# Patient Record
Sex: Female | Born: 1945 | Race: White | Hispanic: No | Marital: Married | Smoking: Former smoker
Health system: Southern US, Community
[De-identification: ages and names within clinical notes are randomized; demographics above are authoritative.]

## PROBLEM LIST (undated history)

## (undated) DIAGNOSIS — K219 Gastro-esophageal reflux disease without esophagitis: Secondary | ICD-10-CM

## (undated) DIAGNOSIS — I10 Essential (primary) hypertension: Secondary | ICD-10-CM

## (undated) HISTORY — PX: FOOT SURGERY: SHX648

## (undated) HISTORY — DX: Essential (primary) hypertension: I10

## (undated) HISTORY — PX: ABDOMINAL HYSTERECTOMY: SHX81

## (undated) HISTORY — PX: HEMORRHOID SURGERY: SHX153

## (undated) HISTORY — PX: CATARACT EXTRACTION, BILATERAL: SHX1313

## (undated) HISTORY — DX: Gastro-esophageal reflux disease without esophagitis: K21.9

---

## 2004-03-04 ENCOUNTER — Other Ambulatory Visit: Payer: Self-pay

## 2007-01-27 ENCOUNTER — Ambulatory Visit: Payer: Self-pay | Admitting: Family Medicine

## 2008-02-17 ENCOUNTER — Inpatient Hospital Stay: Payer: Self-pay | Admitting: Podiatry

## 2008-05-31 ENCOUNTER — Ambulatory Visit: Payer: Self-pay | Admitting: Unknown Physician Specialty

## 2008-06-12 ENCOUNTER — Ambulatory Visit: Payer: Self-pay | Admitting: Family Medicine

## 2008-07-11 ENCOUNTER — Ambulatory Visit: Payer: Self-pay | Admitting: General Surgery

## 2009-03-28 ENCOUNTER — Emergency Department: Payer: Self-pay | Admitting: Emergency Medicine

## 2009-10-08 ENCOUNTER — Ambulatory Visit: Payer: Self-pay | Admitting: Gastroenterology

## 2009-10-23 ENCOUNTER — Ambulatory Visit: Payer: Self-pay | Admitting: Gastroenterology

## 2009-11-07 ENCOUNTER — Ambulatory Visit: Payer: Self-pay | Admitting: Internal Medicine

## 2009-11-18 ENCOUNTER — Ambulatory Visit: Payer: Self-pay | Admitting: Internal Medicine

## 2010-10-19 HISTORY — PX: COLONOSCOPY: SHX174

## 2011-05-20 ENCOUNTER — Ambulatory Visit: Payer: Self-pay | Admitting: Internal Medicine

## 2011-08-11 IMAGING — US ABDOMEN ULTRASOUND
1 series · 17 of 25 positions shown · non-contrast
Comparison: none

REASON FOR EXAM: epigastric abd pain   ruq abd pain  chest pain and
reflux gerd   CCK IF US NEG
COMMENTS:

[Series 1: abdomen ultrasound · 17 of 74 slices shown]
[im 1/74]
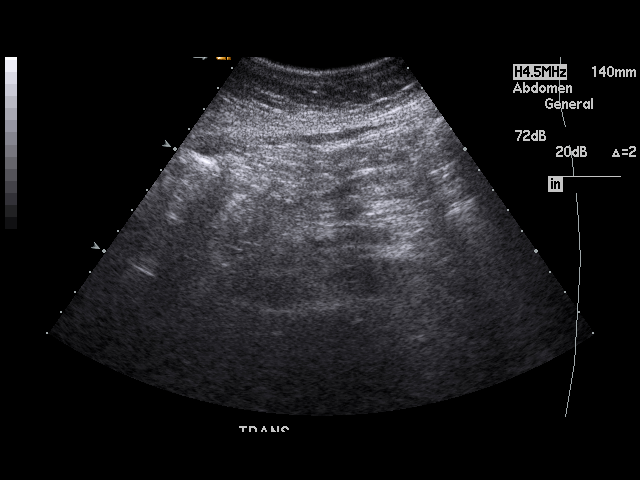
[im 7/74]
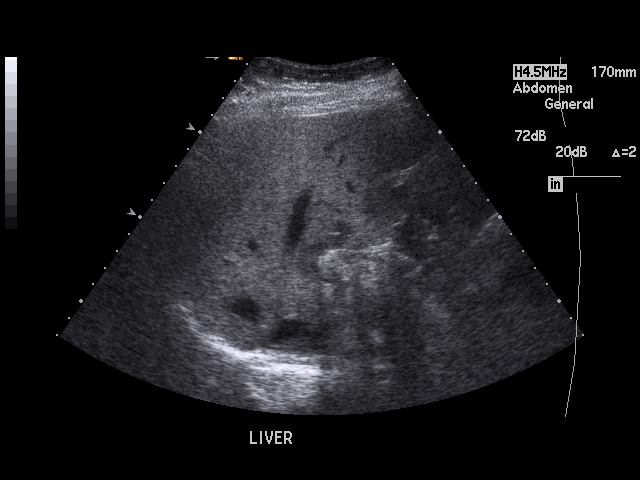
[im 10/74]
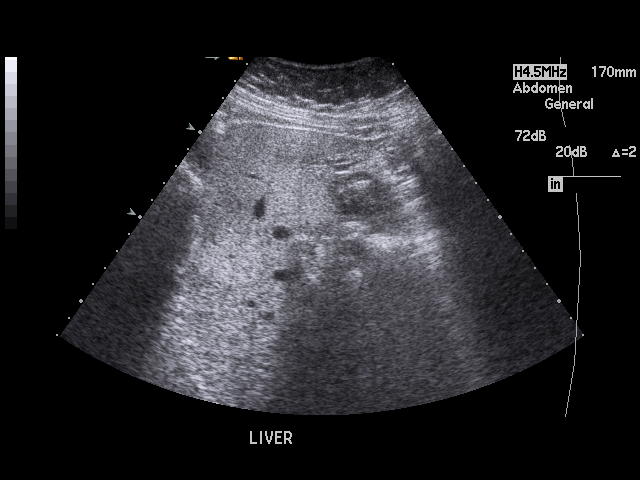
[im 16/74]
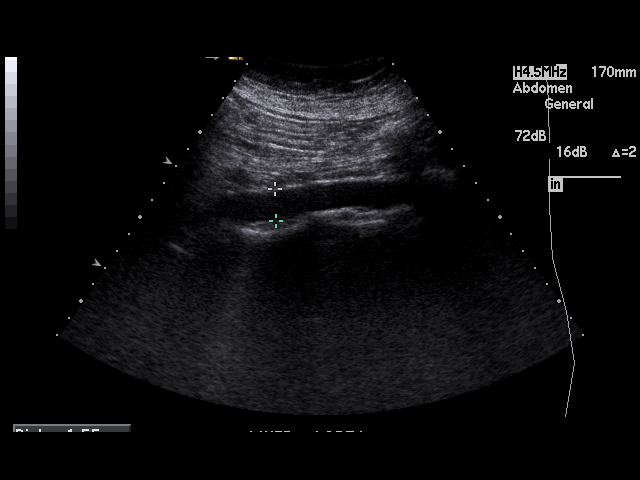
[im 19/74]
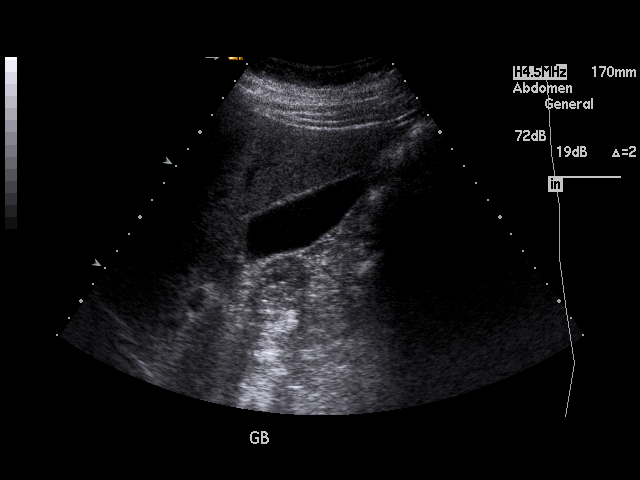
[im 25/74]
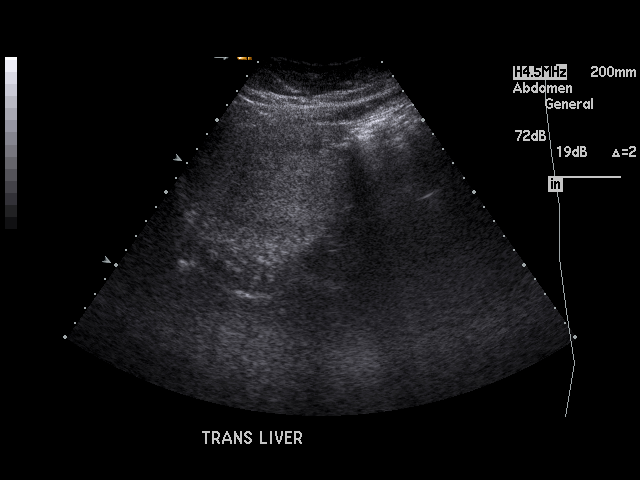
[im 28/74]
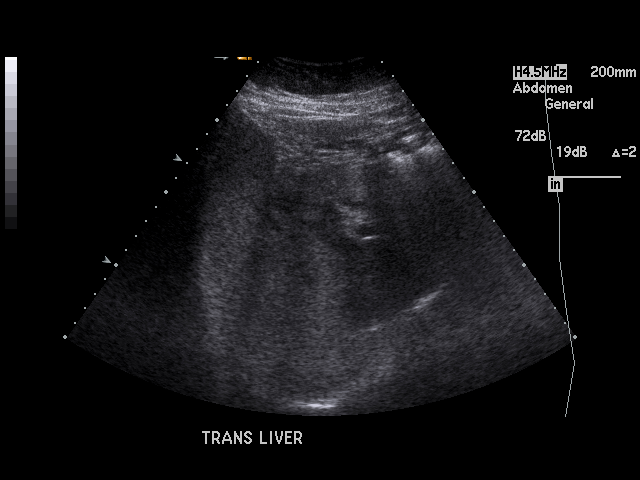
[im 34/74]
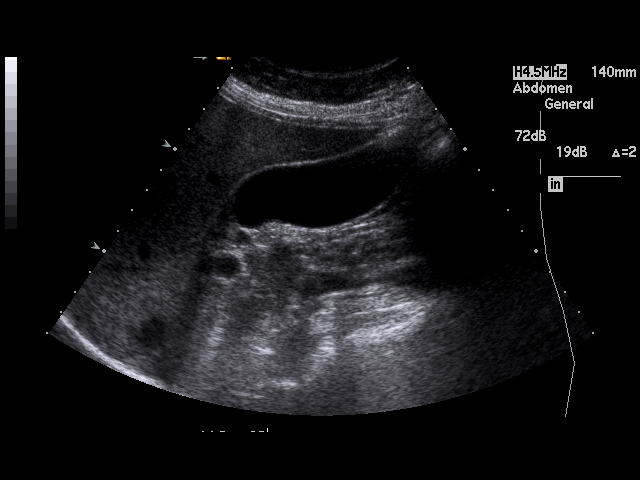
[im 37/74]
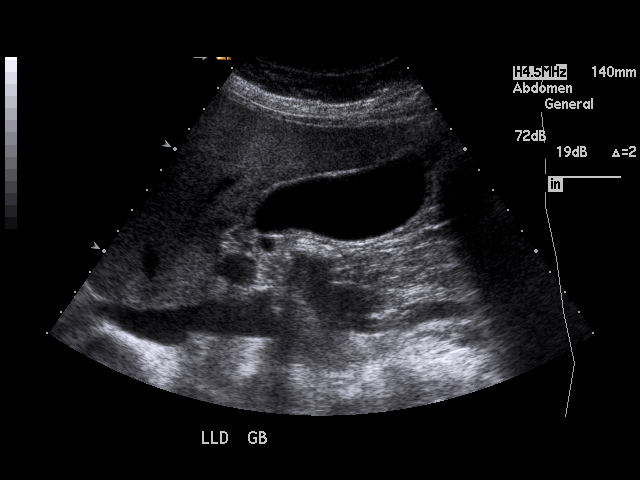
[im 40/74]
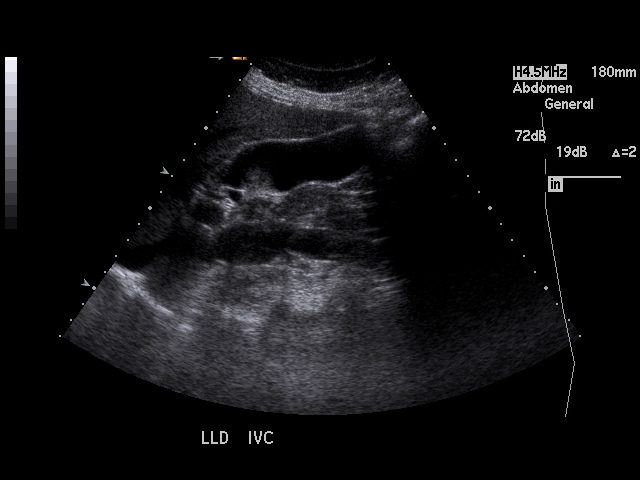
[im 46/74]
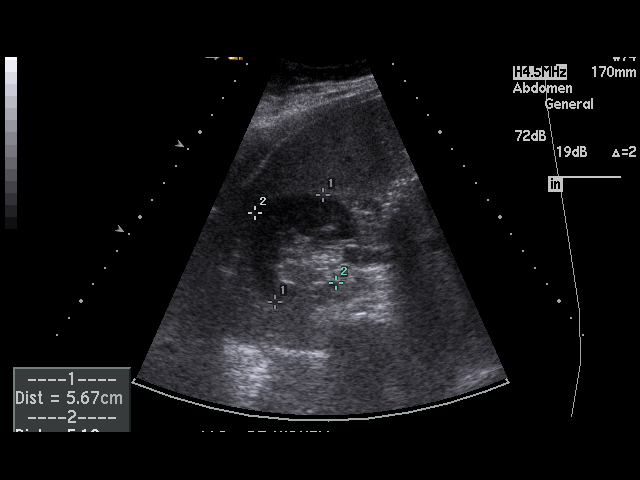
[im 49/74]
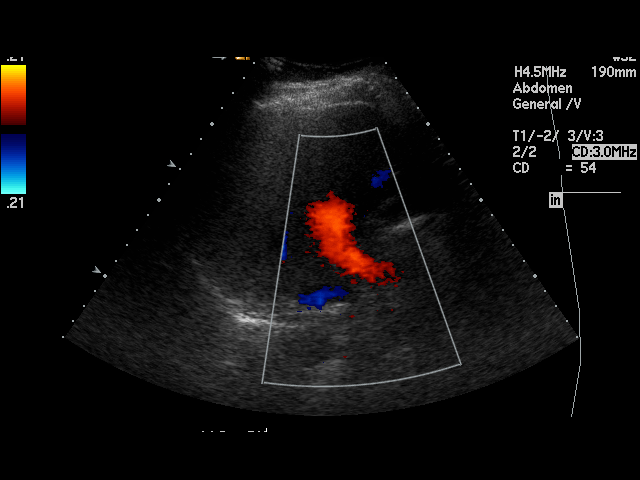
[im 55/74]
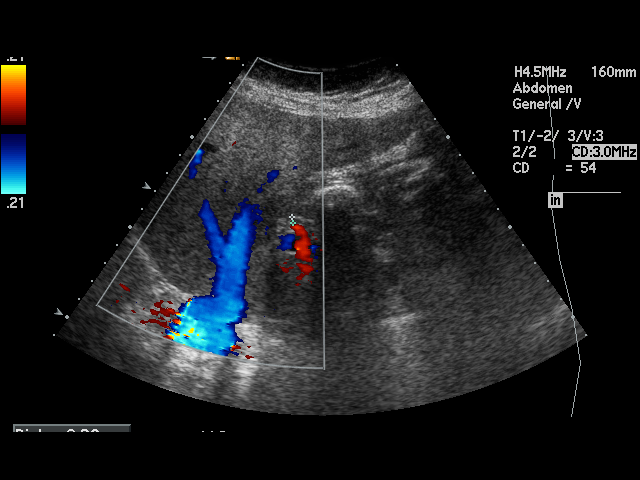
[im 58/74]
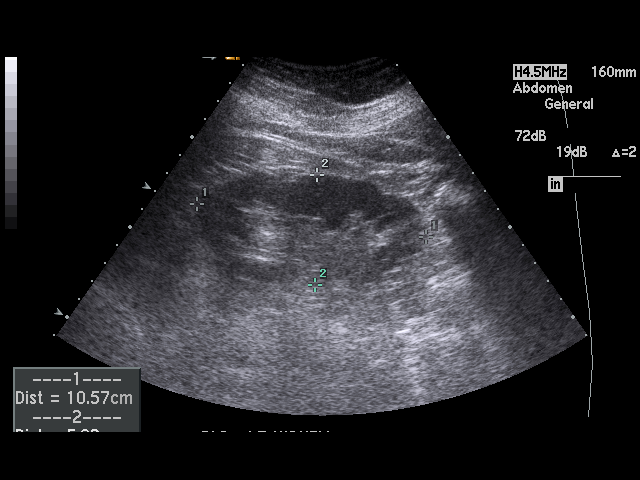
[im 64/74]
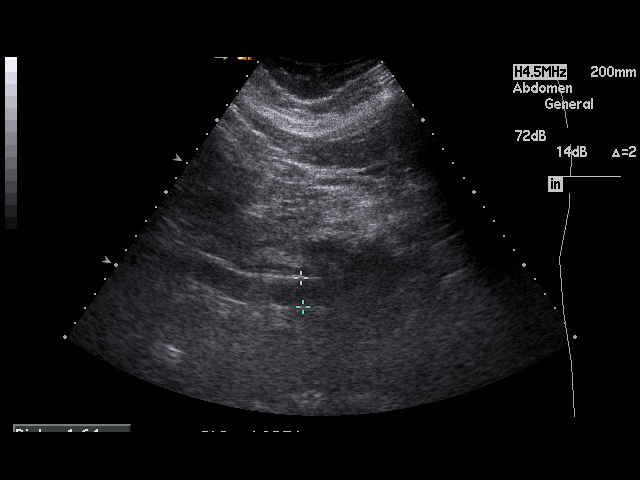
[im 67/74]
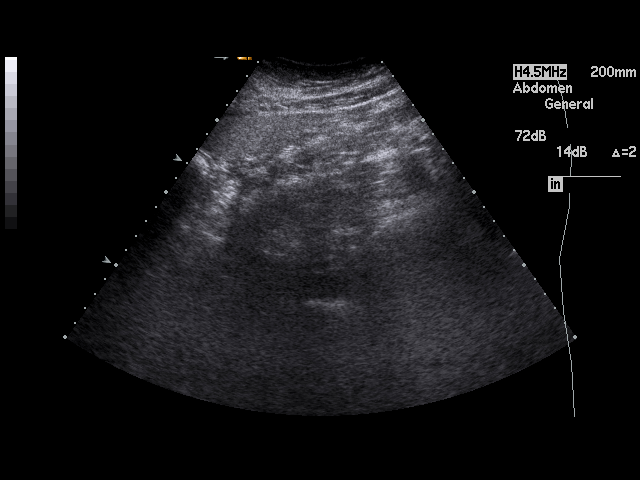
[im 74/74]
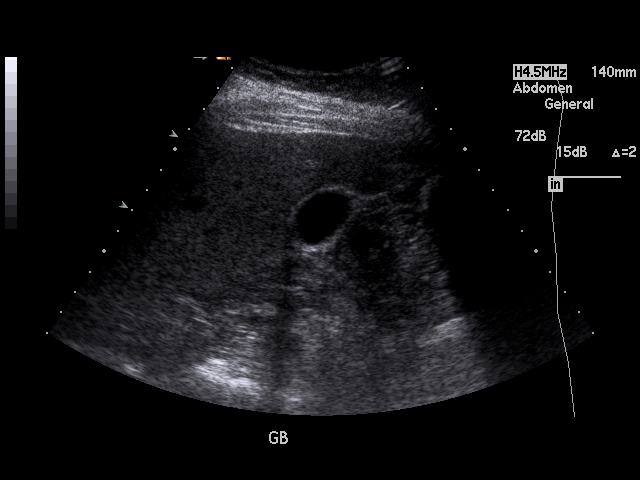

[17 of 25 positions shown; findings below may reference images not displayed]

PROCEDURE:     US  - US ABDOMEN GENERAL SURVEY  - October 08, 2009  [DATE]

RESULT:     The liver exhibits heterogeneous echotexture. I do not see
definite masses. There is likely an area of focal fatty sparing near the
porta hepatis. No intrahepatic ductal dilation is seen. Portal venous flow
is normal in direction toward the liver The pancreas could not be
demonstrated due to the presence of bowel gas. The gallbladder is adequately
distended with no evidence of stones, wall thickening, or pericholecystic
fluid. There is no sonographic Murphy's sign. The common bile duct is normal
at 3.3 mm in diameter.

The spleen, abdominal aorta, and kidneys are normal in appearance. There is
no evidence of ascites.
IMPRESSION: 1. There is fatty infiltrative change of the liver and somewhat
heterogeneous echotexture without definite discrete mass.
2. I do not see evidence of gallstones or evidence of other acute
hepatobiliary abnormality.

## 2011-08-28 ENCOUNTER — Ambulatory Visit: Payer: Self-pay | Admitting: General Surgery

## 2011-09-16 ENCOUNTER — Ambulatory Visit: Payer: Self-pay | Admitting: General Surgery

## 2011-10-01 ENCOUNTER — Ambulatory Visit: Payer: Self-pay | Admitting: General Surgery

## 2011-10-01 HISTORY — PX: CHOLECYSTECTOMY: SHX55

## 2011-12-21 ENCOUNTER — Ambulatory Visit: Payer: Self-pay | Admitting: Ophthalmology

## 2011-12-21 LAB — CREATININE, SERUM
Creatinine: 0.66 mg/dL (ref 0.60–1.30)
EGFR (African American): >60
EGFR (Non-African Amer.): >60

## 2011-12-28 ENCOUNTER — Ambulatory Visit: Payer: Self-pay | Admitting: Ophthalmology

## 2011-12-28 LAB — POTASSIUM: Potassium: 3.7 mmol/L (ref 3.5–5.1)

## 2012-01-06 ENCOUNTER — Ambulatory Visit: Payer: Self-pay | Admitting: Ophthalmology

## 2012-03-03 ENCOUNTER — Ambulatory Visit: Payer: Self-pay | Admitting: Ophthalmology

## 2012-03-03 LAB — POTASSIUM: Potassium: 4 mmol/L (ref 3.5–5.1)

## 2012-03-14 ENCOUNTER — Ambulatory Visit: Payer: Self-pay | Admitting: Ophthalmology

## 2012-04-22 ENCOUNTER — Ambulatory Visit: Payer: Self-pay | Admitting: Gastroenterology

## 2012-06-21 ENCOUNTER — Ambulatory Visit: Payer: Self-pay | Admitting: General Surgery

## 2012-06-21 LAB — BASIC METABOLIC PANEL
Anion Gap: 6 — ABNORMAL LOW (ref 7–16)
BUN: 9 mg/dL (ref 7–18)
Calcium, Total: 9.4 mg/dL (ref 8.5–10.1)
Co2: 29 mmol/L (ref 21–32)
EGFR (Non-African Amer.): >60
Glucose: 93 mg/dL (ref 65–99)
Osmolality: 270 (ref 275–301)
Potassium: 3.1 mmol/L — ABNORMAL LOW (ref 3.5–5.1)

## 2012-06-21 LAB — CBC WITH DIFFERENTIAL/PLATELET
Basophil %: 0.7 %
Eosinophil %: 1.7 %
HCT: 34.7 % — ABNORMAL LOW (ref 35.0–47.0)
Lymphocyte %: 35.6 %
MCH: 29.2 pg (ref 26.0–34.0)
MCHC: 33.8 g/dL (ref 32.0–36.0)
MCV: 86 fL (ref 80–100)
Neutrophil #: 2.9 10*3/uL (ref 1.4–6.5)
RDW: 13.3 % (ref 11.5–14.5)

## 2012-06-28 HISTORY — PX: NISSEN FUNDOPLICATION: SHX2091

## 2012-06-28 LAB — POTASSIUM: Potassium: 3.7 mmol/L (ref 3.5–5.1)

## 2012-06-30 ENCOUNTER — Inpatient Hospital Stay: Payer: Self-pay | Admitting: General Surgery

## 2012-07-01 LAB — CBC WITH DIFFERENTIAL/PLATELET
Basophil #: 0 10*3/uL (ref 0.0–0.1)
Eosinophil #: 0.2 10*3/uL (ref 0.0–0.7)
Eosinophil %: 2.8 %
HCT: 33 % — ABNORMAL LOW (ref 35.0–47.0)
Lymphocyte #: 1.2 10*3/uL (ref 1.0–3.6)
Lymphocyte %: 20.6 %
MCH: 29 pg (ref 26.0–34.0)
MCV: 88 fL (ref 80–100)
Monocyte #: 0.4 x10 3/mm (ref 0.2–0.9)
Monocyte %: 6.9 %
Neutrophil #: 4 10*3/uL (ref 1.4–6.5)
RDW: 13.5 % (ref 11.5–14.5)
WBC: 5.8 10*3/uL (ref 3.6–11.0)

## 2012-12-07 ENCOUNTER — Ambulatory Visit: Payer: Self-pay | Admitting: Internal Medicine

## 2013-01-29 ENCOUNTER — Encounter: Payer: Self-pay | Admitting: General Surgery

## 2015-02-05 NOTE — Op Note (Signed)
PATIENT NAME:  Patricia Vaughan, Patricia Vaughan MR#:  [id number] DATE OF BIRTH:  Nov 07, 1945  DATE OF PROCEDURE:  06/28/2012  PREOPERATIVE DIAGNOSIS: Uncontrolled gastroesophageal reflux.   POSTOPERATIVE DIAGNOSIS: Uncontrolled gastroesophageal reflux.   OPERATIVE PROCEDURES:  1. Diagnostic laparoscopy.  2. Exposure of esophageal hiatus and mobilization of the esophagus to the level of the pulmonary vasculature. 3. Release of the right diaphragm with repair of the diaphragmatic defect with xenograft placement. 4. Nissen fundoplication. 5. Gastropexy.   SURGEON: Earline MayotteJeffrey W. Kaelen Caughlin, MD  ASSISTANT: Effie ShyMichael Tyner, M.D.   ANESTHESIA: General endotracheal.   ESTIMATED BLOOD LOSS: 20 mL.   CLINICAL NOTE: This 69 year old woman has had recurrent episodes of chest pain. Cardiac evaluation is negative. Transient improvement was noted after cholecystectomy but recurrent symptoms since that time. Upper endoscopy was notable for a GE junction at 37 cm. Bravo pH  study was suggestive of reflux. She was felt to be a candidate for Nissen fundoplication. Previous upper GI series had not demonstrated significant esophageal dysmotility.   OPERATIVE NOTE: With the patient under adequate general endotracheal anesthesia and a Foley catheter in place, she was placed in dorsal lithotomy, she was placed in stirrups supported on a beanbag. Kefzol was administered. The abdomen was prepped with ChloraPrep and draped. A Veress needle was passed above the level of the umbilicus and pneumoperitoneum established at 12 mmHg pressure. After pneumoperitoneum was appropriate, a 5 mm step port was placed 4 cm to the left of the midline and approximately 6 cm superior to this. After assuring intra-abdominal location the port was expanded. A 5 mm angled scope was placed and good visualization obtained. No evidence of injury from initial port placement. A 5 mm port was placed well lateral on the left side, 11 mm port underneath the left costal  margin just outside the rectus sheath and a 5 mm port placed laterally. Initial incision was made in the epigastrium for a liver retractor. The left lobe of the liver was retracted superiorly. A 5 cm hiatal defect was noted. The stomach was easily brought into the abdominal cavity. The peritoneum covering lesser curvature of the stomach and the lesser omentum was divided with the Harmonic scalpel. There was no discernible left hepatic vessel. The right crus was identified and cleared as was the left crus. A Penrose drain was placed around the esophagus to apply traction. The esophagus was then mobilized circumferentially with visualization of the anterior and posterior vagus nerves throughout the dissection. The dissection was carried to the level of the pulmonary vasculature. A very small pleural rent was noted on the right side with no discernible change in ventilator pressures or hypoxemia. The esophagus was mobilized to allow 4 cm of tension-free esophagus within the abdominal cavity. The short gastrics had previously been taken down with the use of the harmonic scalpel. The fundus was grasped and brought behind the esophagus. A single suture with a complete wrap was completed over a 54 French bougie. Additional sutures were then placed individually between the right and left sides of the fundoplication and the esophagus to make a modified toupee repair in the event there was a component of esophageal dysmotility. Prior to fundoplication a relaxing incision was made lateral to the right crus and medial to the inferior vena cava. This was extended up over onto the diaphragm exposing the pericardium. This defect was covered with a 7 x 10 cm ACell porcine xenograft appropriately orientated. This was tacked into place with the Ethicon stapler. The fundoplication wrap was then  anchored to the crus on the right side with interrupted 0 PDS sutures. A gastropexy was completed making use of a 3-0 monofilament from the  lesser curvature to the anterior abdominal wall. Inspection showed good hemostasis. The abdomen was then desufflated under direct vision. Skin incisions were closed with 4-0 Vicryl subcuticular sutures. Dermabond was applied.   Patient tolerated the procedure well and was taken to the recovery room in stable condition.   ____________________________ Earline Mayotte, MD jwb:cms D: 06/28/2012 22:20:00 ET T: 06/29/2012 09:11:23 ET JOB#: [id number]  cc: Earline Mayotte, MD, <Dictator> Tyrone Apple. Alva Garnet, MD Reola Mosher. Randa Lynn, MD  Marysol Wellnitz Brion Aliment MD ELECTRONICALLY SIGNED 06/30/2012 15:01

## 2015-02-10 NOTE — Op Note (Signed)
PATIENT NAME:  Patricia Vaughan, Patricia Vaughan MR#:  [id number] DATE OF BIRTH:  11/08/1945  DATE OF PROCEDURE:  01/06/2012  PREOPERATIVE DIAGNOSIS:  Cataract, left eye.    POSTOPERATIVE DIAGNOSIS:  Cataract, left eye.  PROCEDURE PERFORMED:  Extracapsular cataract extraction using phacoemulsification with placement of an Alcon SN6CWS, 21.5-diopter posterior chamber lens, serial #[id number].  SURGEON:  Maylon PeppersSteven A. Keiran Sias, MD  ASSISTANT:  None.  ANESTHESIA:  4% lidocaine and 0.75% Marcaine in a 50/50 mixture with 10 units/mL of Hylenex added, given as a peribulbar.   ANESTHESIOLOGIST:  Dr. Noralyn Pickarroll  COMPLICATIONS:  None.  ESTIMATED BLOOD LOSS:  Less than 1 ml.  DESCRIPTION OF PROCEDURE:  The patient was brought to the operating room and given a peribulbar block.  The patient was then prepped and draped in the usual fashion.  The vertical rectus muscles were imbricated using 5-0 silk sutures.  These sutures were then clamped to the sterile drapes as bridle sutures.  A limbal peritomy was performed extending two clock hours and hemostasis was obtained with cautery.  A partial thickness scleral groove was made at the surgical limbus and dissected anteriorly in a lamellar dissection using an Alcon crescent knife.  The anterior chamber was entered supero-temporally with a Superblade and through the lamellar dissection with a 2.6 mm keratome.  DisCoVisc was used to replace the aqueous and a continuous tear capsulorrhexis was carried out.  Hydrodissection and hydrodelineation were carried out with balanced salt and a 27 gauge canula.  The nucleus was rotated to confirm the effectiveness of the hydrodissection.  Phacoemulsification was carried out using a divide-and-conquer technique.  Total ultrasound time was 1 minute and 6 seconds with an average power of 21.8 percent and CDE of 22.33.  Irrigation/aspiration was used to remove the residual cortex.  DisCoVisc was used to inflate the capsule and the internal  incision was enlarged to 3 mm with the crescent knife.  The intraocular lens was folded and inserted into the capsular bag using the AcrySert delivery system. Irrigation/aspiration was used to remove the residual DisCoVisc.  Miostat was injected into the anterior chamber through the paracentesis track to inflate the anterior chamber and induce miosis.  The wound was checked for leaks and none were found. The conjunctiva was closed with cautery and the bridle sutures were removed.  Two drops of 0.3% Vigamox were placed on the eye.   An eye shield was placed on the eye.  The patient was discharged to the recovery room in good condition. ____________________________ Maylon PeppersSteven A. Tyffani Foglesong, MD sad:slb D: 01/06/2012 15:15:00 ET T: 01/06/2012 15:48:59 ET JOB#: [id number]  cc: Viviann SpareSteven A. Aesha Agrawal, MD, <Dictator> Erline LevineSTEVEN A Corneshia Hines MD ELECTRONICALLY SIGNED 01/07/2012 10:21

## 2015-02-10 NOTE — Op Note (Signed)
PATIENT NAME:  Patricia SabalCAUDLE, Beryle C MR#:  [id number] DATE OF BIRTH:  [birth year]  DATE OF PROCEDURE:  03/14/2012  PREOPERATIVE DIAGNOSIS:  Cataract, right eye.   POSTOPERATIVE DIAGNOSIS:  Cataract, right eye.  PROCEDURE PERFORMED:  Extracapsular cataract extraction using phacoemulsification with placement of an Alcon SN6CWS, 21.5-diopter posterior chamber lens, serial #[id number].  SURGEON:  Maylon PeppersSteven A. Shatara Stanek, MD  ASSISTANT:  None.  ANESTHESIA:  4% lidocaine and 0.75% Marcaine in a 50/50 mixture with 10 units/mL of Hylenex added, given as a peribulbar.  ANESTHESIOLOGIST:  Gwenyth BenderJames Adams, MD  COMPLICATIONS:  None.  ESTIMATED BLOOD LOSS:  Less than 1 ml.  DESCRIPTION OF PROCEDURE:  The patient was brought to the operating room and given a peribulbar block.  The patient was then prepped and draped in the usual fashion.  The vertical rectus muscles were imbricated using 5-0 silk sutures.  These sutures were then clamped to the sterile drapes as bridle sutures.  A limbal peritomy was performed extending two clock hours and hemostasis was obtained with cautery.  A partial thickness scleral groove was made at the surgical limbus and dissected anteriorly in a lamellar dissection using an Alcon crescent knife.  The anterior chamber was entered superonasally with a Superblade and through the lamellar dissection with a 2.6 mm keratome.  DisCoVisc was used to replace the aqueous and a continuous tear capsulorrhexis was carried out.  Hydrodissection and hydrodelineation were carried out with balanced salt and a 27 gauge canula.  The nucleus was rotated to confirm the effectiveness of the hydrodissection.  Phacoemulsification was carried out using a divide-and-conquer technique.  Total ultrasound time was 1 minute and 7 seconds with an average power of 18.1 percent.  Irrigation/aspiration was used to remove the residual cortex.  DisCoVisc was used to inflate the capsule and the internal incision was  enlarged to 3 mm with the crescent knife.  The intraocular lens was folded and inserted into the capsular bag using the AcrySert delivery system. Irrigation/aspiration was used to remove the residual DisCoVisc.  Miostat was injected into the anterior chamber through the paracentesis track to inflate the anterior chamber and induce miosis.  The wound was checked for leaks and none were found. The conjunctiva was closed with cautery and the bridle sutures were removed.  Two drops of 0.3% Vigamox were placed on the eye.   An eye shield was placed on the eye.  The patient was discharged to the recovery room in good condition. ____________________________ Maylon PeppersSteven A. Kemon Devincenzi, MD sad:slb D: 03/14/2012 13:11:04 ET T: 03/14/2012 14:12:07 ET JOB#: [id number]  cc: Viviann SpareSteven A. Younique Casad, MD, <Dictator> Erline LevineSTEVEN A Nikie Cid MD ELECTRONICALLY SIGNED 03/21/2012 12:53

## 2015-05-17 ENCOUNTER — Other Ambulatory Visit: Payer: Self-pay | Admitting: Family Medicine

## 2015-05-17 DIAGNOSIS — Z1231 Encounter for screening mammogram for malignant neoplasm of breast: Secondary | ICD-10-CM

## 2015-06-06 ENCOUNTER — Encounter (INDEPENDENT_AMBULATORY_CARE_PROVIDER_SITE_OTHER): Payer: Self-pay

## 2015-06-06 ENCOUNTER — Other Ambulatory Visit: Payer: Self-pay | Admitting: Family Medicine

## 2015-06-06 ENCOUNTER — Ambulatory Visit: Payer: Self-pay

## 2015-06-06 ENCOUNTER — Ambulatory Visit
Admission: RE | Admit: 2015-06-06 | Discharge: 2015-06-06 | Disposition: A | Discharge status: Home / Self Care | Payer: Medicare Other | Source: Ambulatory Visit | Attending: Family Medicine | Admitting: Family Medicine

## 2015-06-06 DIAGNOSIS — Z1231 Encounter for screening mammogram for malignant neoplasm of breast: Secondary | ICD-10-CM

## 2016-07-15 ENCOUNTER — Other Ambulatory Visit: Payer: Self-pay | Admitting: Family Medicine

## 2016-07-20 ENCOUNTER — Other Ambulatory Visit: Payer: Self-pay | Admitting: Family Medicine

## 2016-07-20 DIAGNOSIS — Z1231 Encounter for screening mammogram for malignant neoplasm of breast: Secondary | ICD-10-CM

## 2016-08-13 ENCOUNTER — Ambulatory Visit
Admission: RE | Admit: 2016-08-13 | Discharge: 2016-08-13 | Disposition: A | Discharge status: Home / Self Care | Payer: Medicare Other | Source: Ambulatory Visit | Attending: Family Medicine | Admitting: Family Medicine

## 2016-08-13 DIAGNOSIS — Z1231 Encounter for screening mammogram for malignant neoplasm of breast: Secondary | ICD-10-CM

## 2018-07-07 ENCOUNTER — Other Ambulatory Visit: Payer: Self-pay | Admitting: Family Medicine

## 2018-08-25 ENCOUNTER — Other Ambulatory Visit: Payer: Self-pay | Admitting: Family Medicine

## 2018-08-25 DIAGNOSIS — Z1231 Encounter for screening mammogram for malignant neoplasm of breast: Secondary | ICD-10-CM

## 2018-09-29 ENCOUNTER — Encounter: Payer: Self-pay | Admitting: General Surgery

## 2018-09-29 ENCOUNTER — Ambulatory Visit (INDEPENDENT_AMBULATORY_CARE_PROVIDER_SITE_OTHER): Payer: Medicare Other | Admitting: General Surgery

## 2018-09-29 ENCOUNTER — Other Ambulatory Visit: Payer: Self-pay

## 2018-09-29 VITALS — BP 171/85 | HR 75 | Temp 97.9°F | Resp 14 | Ht 64.0 in | Wt 176.0 lb

## 2018-09-29 DIAGNOSIS — K625 Hemorrhage of anus and rectum: Secondary | ICD-10-CM

## 2018-09-29 LAB — POC HEMOCCULT BLD/STL (OFFICE/1-CARD/DIAGNOSTIC): Fecal Occult Blood, POC: NEGATIVE

## 2018-09-29 NOTE — Patient Instructions (Signed)
Call us with any further problems. Follow up as needed.

## 2018-09-29 NOTE — Progress Notes (Signed)
Patient ID: Patricia SabalRebecca C Slatten, female   DOB: 09/06/1946, 72 y.o.   MRN: [id number]  Chief Complaint  Patient presents with  . Rectal Problems    HPI Patricia Vaughan is a 72 y.o. female.  Here for evaluation of rectal bleeding. She states this weekend she noticed loose BM's with some urgency. She states by Monday she was seeing blood on stool and in the toilet bowel.  Bright red in nature she admits to  abdominal cramping as well. Last episode of bleeding was Tuesday. No rectal pain. She has not had a bowel movement since Tuesday.  Prior to this, bowels move regular with stool softener. The patient has had previous episodes of rectal bleeding evaluated by colonoscopy in August 2009 by Dr. Mechele CollinElliott and in November 2012 by myself.  HPI  Past Medical History:  Diagnosis Date  . GERD (gastroesophageal reflux disease)   . Hypertension     Past Surgical History:  Procedure Laterality Date  . ABDOMINAL HYSTERECTOMY    . CATARACT EXTRACTION, BILATERAL    . CHOLECYSTECTOMY  10/01/2011   Dr Lemar LivingsByrnett  . COLONOSCOPY  2012   Dr Lemar LivingsByrnett  . FOOT SURGERY  2005 2009  . HEMORRHOID SURGERY     Dr Lemar LivingsByrnett  . NISSEN FUNDOPLICATION  06/28/2012   Dr Lemar LivingsByrnett    Family History  Problem Relation Age of Onset  . Colon cancer Neg Hx     Social History Social History   Tobacco Use  . Smoking status: Former Smoker    Packs/day: 0.50    Years: 10.00    Pack years: 5.00    Types: Cigarettes  . Smokeless tobacco: Never Used  Substance Use Topics  . Alcohol use: Yes    Frequency: Never    Comment: occasionally  . Drug use: Never    No Known Allergies  Current Outpatient Medications  Medication Sig Dispense Refill  . Calcium Carb-Cholecalciferol 600-800 MG-UNIT CHEW Chew by mouth 2 (two) times daily.     Marland Kitchen. docusate sodium (COLACE) 100 MG capsule Take by mouth daily.     Marland Kitchen. esomeprazole (NEXIUM) 40 MG capsule Take 40 mg by mouth daily at 12 noon.     . hydrochlorothiazide (HYDRODIURIL) 25 MG  tablet Take 25 mg by mouth daily.     . vitamin B-12 (CYANOCOBALAMIN) 500 MCG tablet Take 250 mcg by mouth daily.      No current facility-administered medications for this visit.     Review of Systems Review of Systems  Constitutional: Negative.   Respiratory: Negative.   Cardiovascular: Negative.   Gastrointestinal: Positive for blood in stool.    Blood pressure (!) 171/85, pulse 75, temperature 97.9 F (36.6 C), temperature source Skin, resp. rate 14, height 5\' 4"  (1.626 m), weight 176 lb (79.8 kg), SpO2 97 %.  Physical Exam Physical Exam Constitutional:      Appearance: She is well-developed.  Eyes:     General: No scleral icterus.    Conjunctiva/sclera: Conjunctivae normal.  Neck:     Musculoskeletal: Neck supple.  Cardiovascular:     Rate and Rhythm: Normal rate and regular rhythm.     Heart sounds: Normal heart sounds.  Pulmonary:     Effort: Pulmonary effort is normal.     Breath sounds: Normal breath sounds.  Abdominal:     General: Abdomen is flat.     Palpations: Abdomen is soft.  Genitourinary:    Rectum: Normal. Guaiac result negative.  Lymphadenopathy:     Cervical:  No cervical adenopathy.  Skin:    General: Skin is warm and dry.  Neurological:     Mental Status: She is alert and oriented to person, place, and time.     Data Reviewed Anoscopy was completed showing normal sphincter tone, small, nonbleeding internal hemorrhoids.  No rectal masses.  Stool was Hemoccult negative.  Assessment    History of rectal bleeding likely secondary to anorectal source based on clinical report and past medical history.    Plan    Patient will be a candidate for follow-up colonoscopy in 2022.  She has been encouraged to call if new rectal bleeding unrelated to diarrheal illnesses occur or if recurrent diarrhea develops.       Merrily Pew Lorita Forinash 10/01/2018, 9:39 AM

## 2018-10-01 DIAGNOSIS — K625 Hemorrhage of anus and rectum: Secondary | ICD-10-CM | POA: Insufficient documentation

## 2018-10-06 ENCOUNTER — Encounter: Payer: Self-pay | Admitting: General Surgery

## 2018-10-24 ENCOUNTER — Ambulatory Visit
Admission: RE | Admit: 2018-10-24 | Discharge: 2018-10-24 | Disposition: A | Discharge status: Home / Self Care | Payer: Medicare Other | Source: Ambulatory Visit | Attending: Family Medicine | Admitting: Family Medicine

## 2018-10-24 DIAGNOSIS — Z1231 Encounter for screening mammogram for malignant neoplasm of breast: Secondary | ICD-10-CM | POA: Diagnosis not present

## 2019-11-24 ENCOUNTER — Other Ambulatory Visit: Payer: Medicare Other

## 2020-08-26 IMAGING — MG DIGITAL SCREENING BILATERAL MAMMOGRAM WITH TOMO AND CAD
8 series · 8 of 24 positions shown · non-contrast
Comparison: Previous exam(s).

CLINICAL DATA: Screening.

EXAM:
DIGITAL SCREENING BILATERAL MAMMOGRAM WITH TOMO AND CAD

[R CC synth-2D]
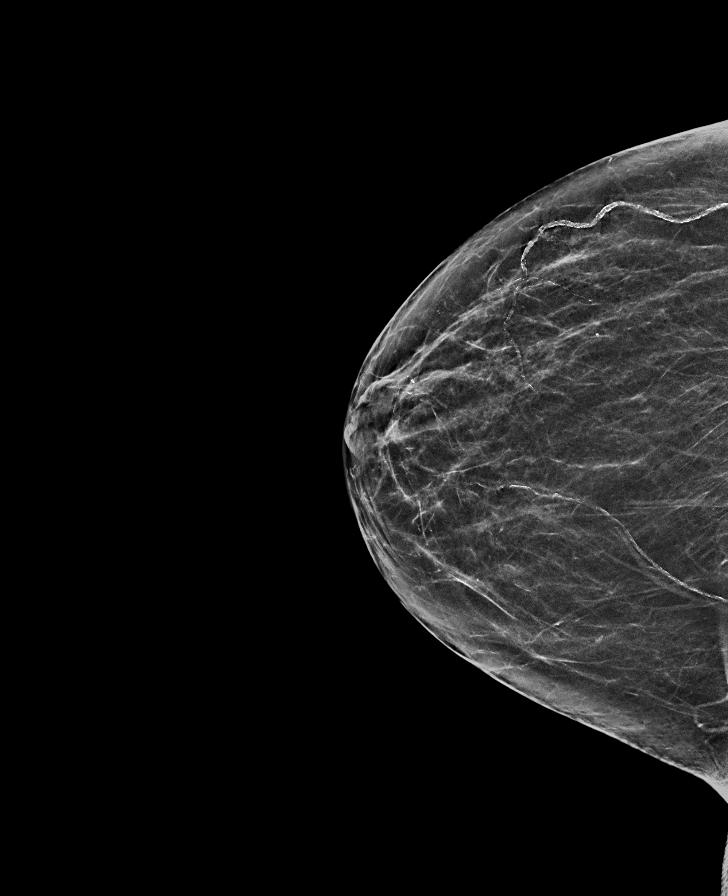

[R MLO synth-2D]
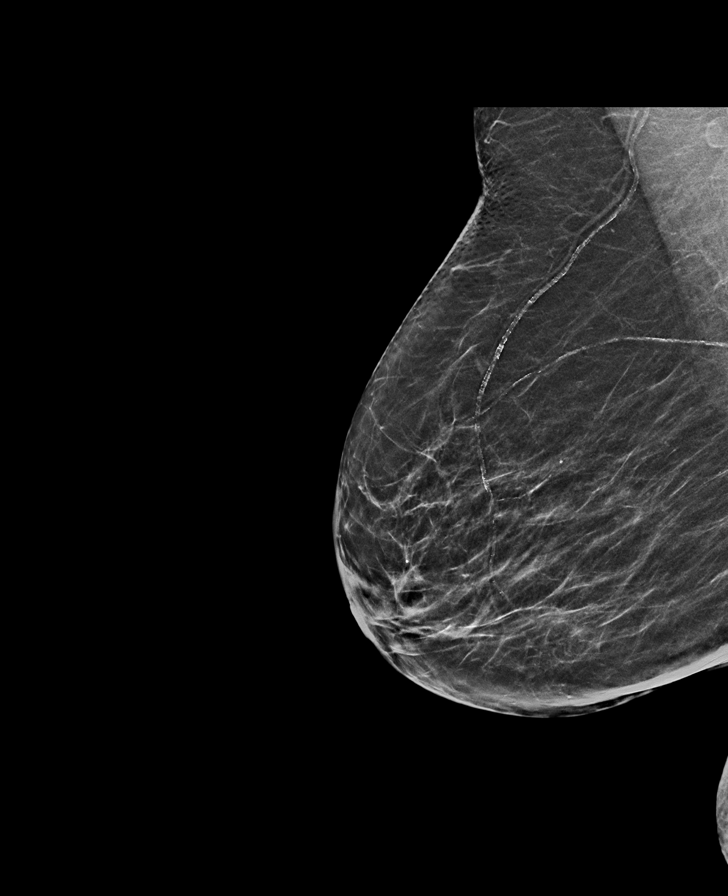

[L MLO synth-2D]
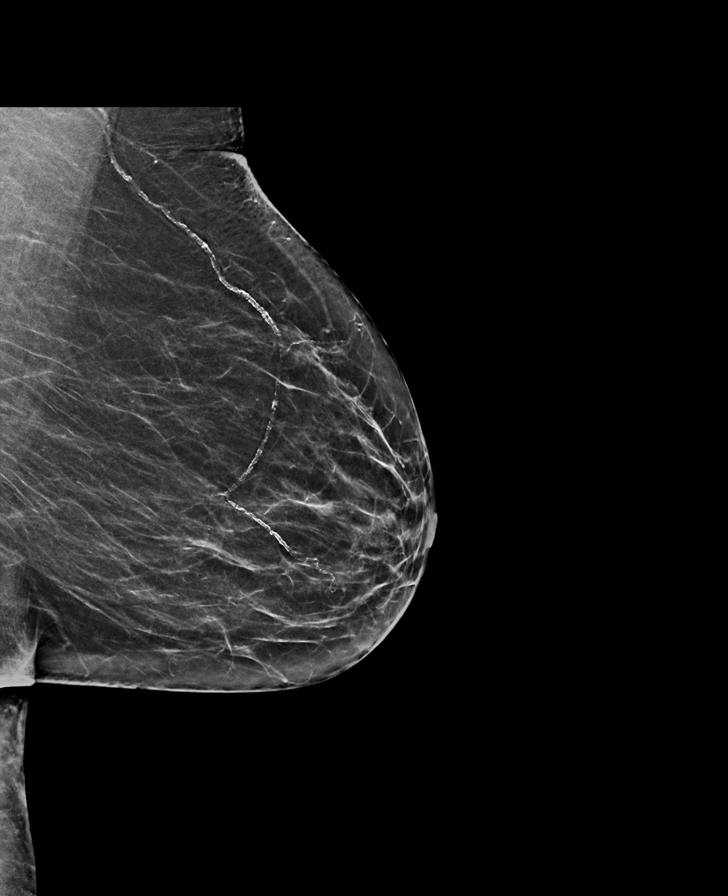

[L CC synth-2D]
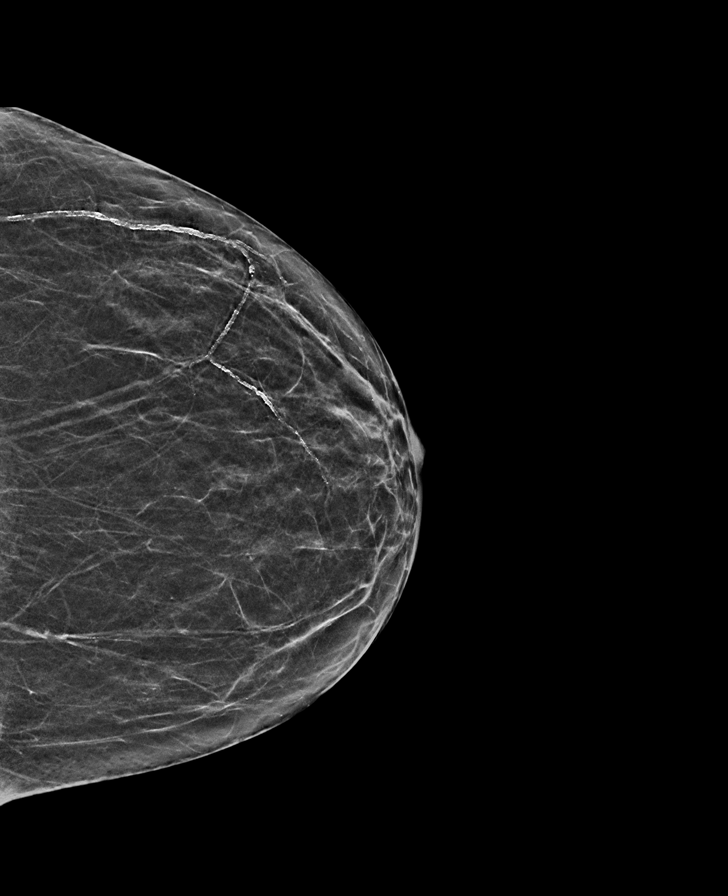

[R MLO tomo · tomo slice 27/54.0]
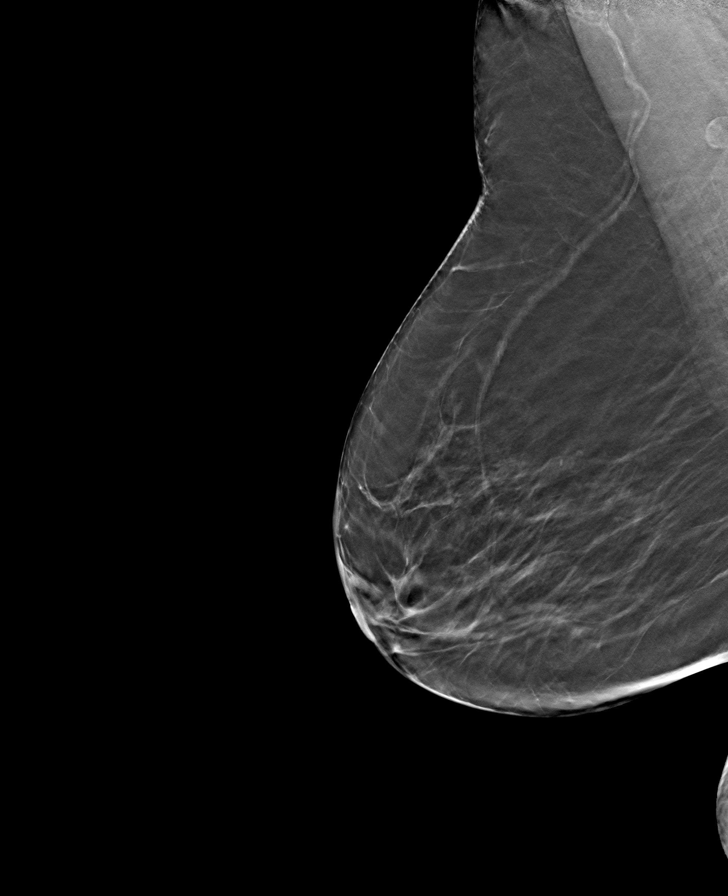

[L CC tomo · tomo slice 23/45.0]
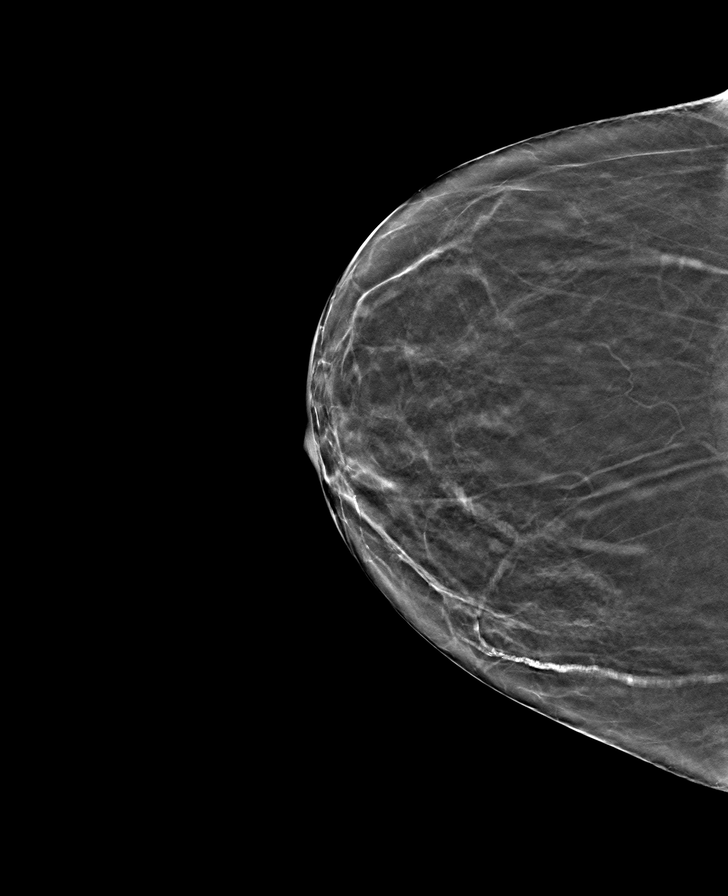

[L MLO tomo · tomo slice 29/56.0]
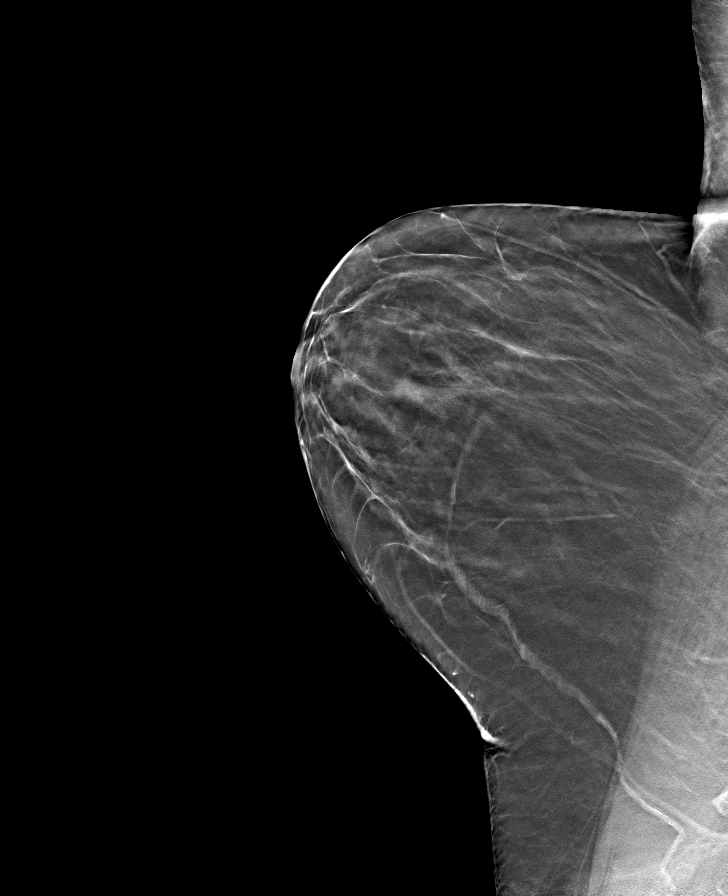

[R CC tomo · tomo slice 25/50.0]
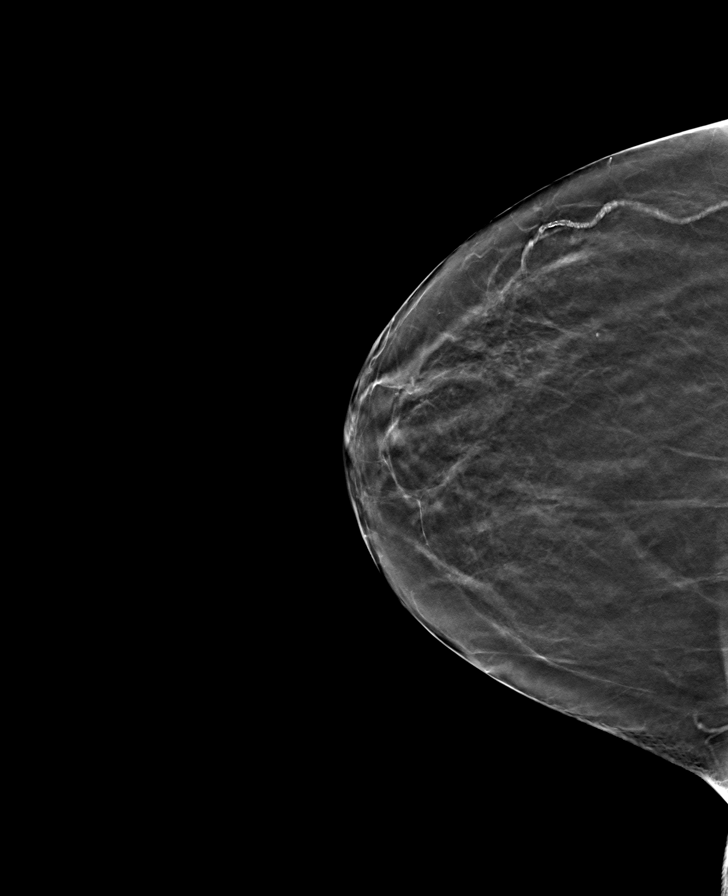

[8 of 24 positions shown; findings below may reference images not displayed]

ACR Breast Density Category b: There are scattered areas of
fibroglandular density.
FINDINGS: There are no findings suspicious for malignancy. Images were
processed with CAD.
IMPRESSION: No mammographic evidence of malignancy. A result letter of this
screening mammogram will be mailed directly to the patient.

RECOMMENDATION:
Screening mammogram in one year. (Code:CN-U-775)

BI-RADS CATEGORY  1: Negative.

## 2023-09-07 ENCOUNTER — Other Ambulatory Visit: Payer: Self-pay | Admitting: Family Medicine

## 2023-09-07 DIAGNOSIS — Z1231 Encounter for screening mammogram for malignant neoplasm of breast: Secondary | ICD-10-CM

## 2023-09-30 ENCOUNTER — Ambulatory Visit
Admission: RE | Admit: 2023-09-30 | Discharge: 2023-09-30 | Disposition: A | Discharge status: Home / Self Care | Payer: Medicare Other | Source: Ambulatory Visit | Attending: Family Medicine | Admitting: Family Medicine

## 2023-09-30 DIAGNOSIS — Z1231 Encounter for screening mammogram for malignant neoplasm of breast: Secondary | ICD-10-CM | POA: Insufficient documentation
